# Patient Record
Sex: Male | Born: 2000 | Race: Black or African American | Hispanic: No | Marital: Single | Smoking: Never smoker
Health system: Southern US, Community
[De-identification: ages and names within clinical notes are randomized; demographics above are authoritative.]

## PROBLEM LIST (undated history)

## (undated) DIAGNOSIS — T7840XA Allergy, unspecified, initial encounter: Secondary | ICD-10-CM

## (undated) HISTORY — DX: Allergy, unspecified, initial encounter: T78.40XA

---
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
## 2019-03-10 IMAGING — DX DG CHEST 1V PORT
2 series · 2 of 2 positions shown · non-contrast
Comparison: 03/16/2017

CLINICAL DATA: Respiratory failure

EXAM:
PORTABLE CHEST 1 VIEW

[chest ap (1 of 2)]
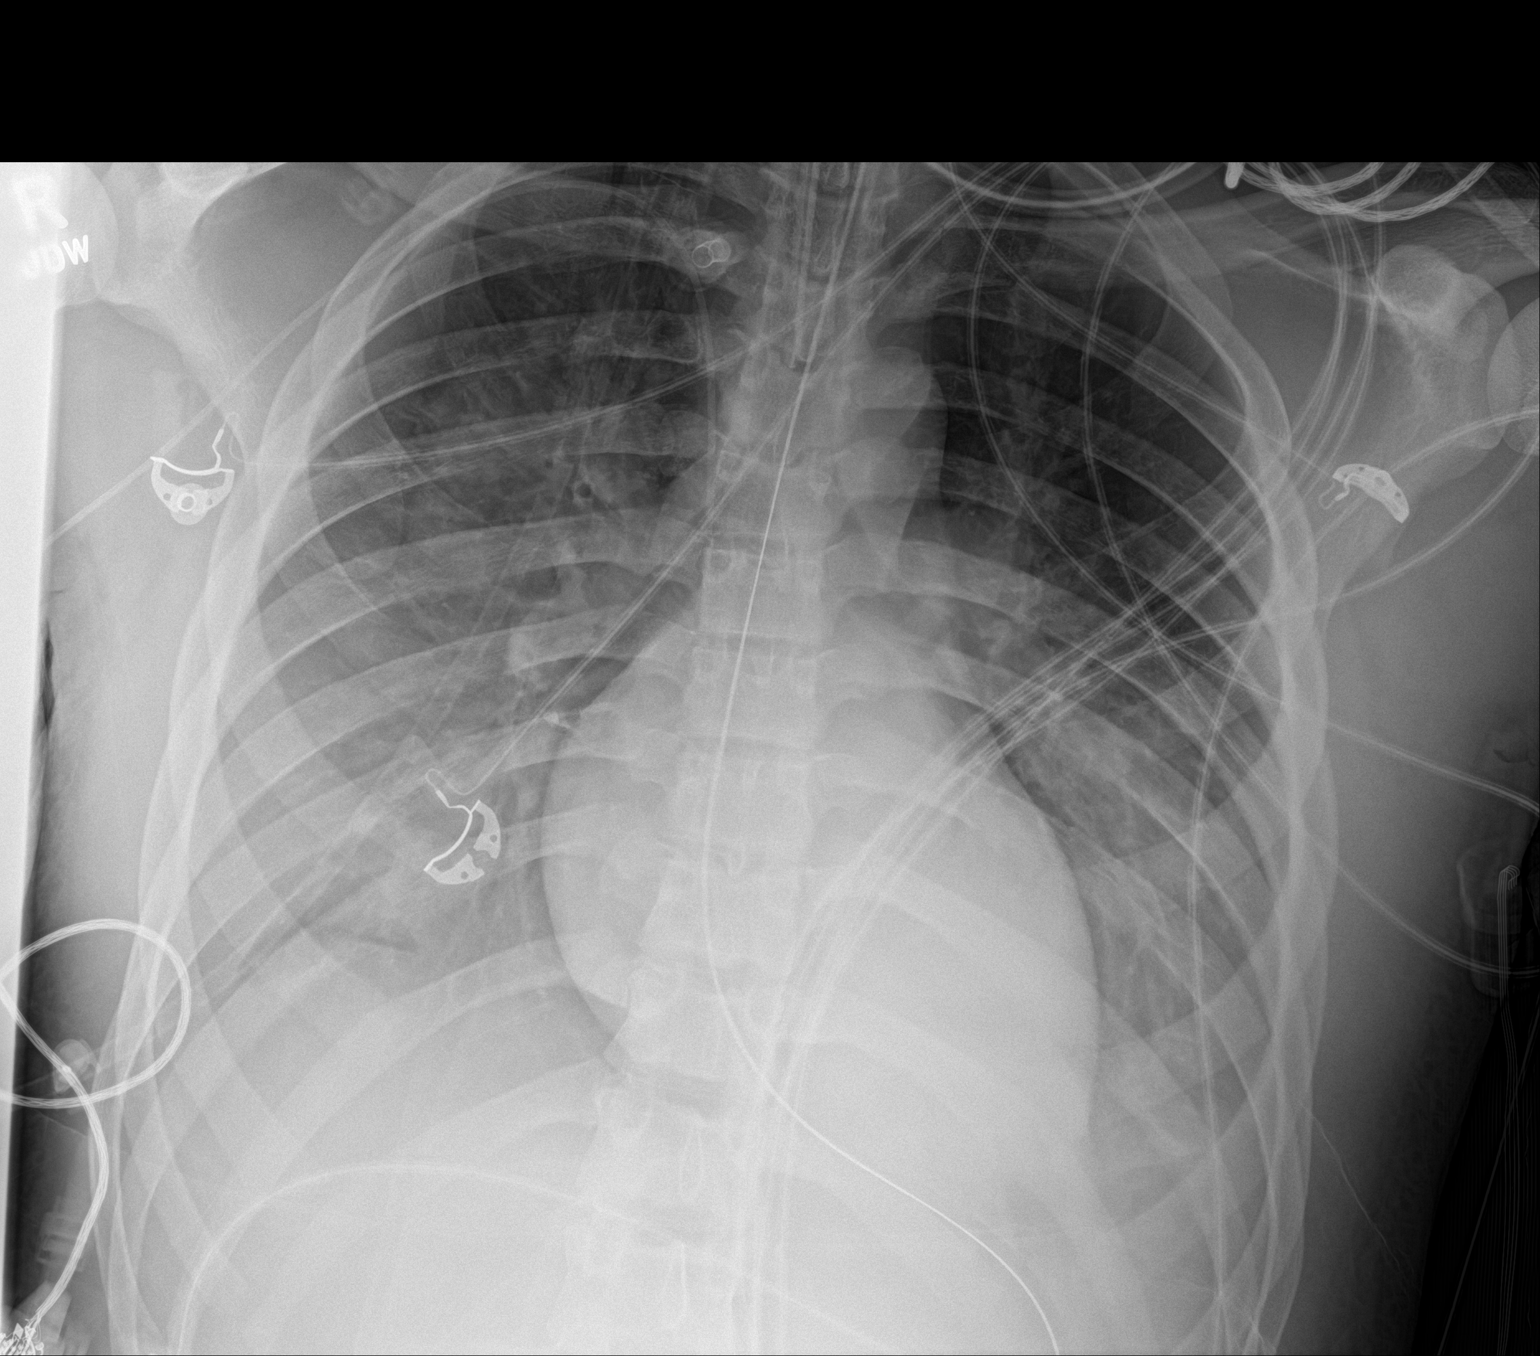

[chest ap (2 of 2)]
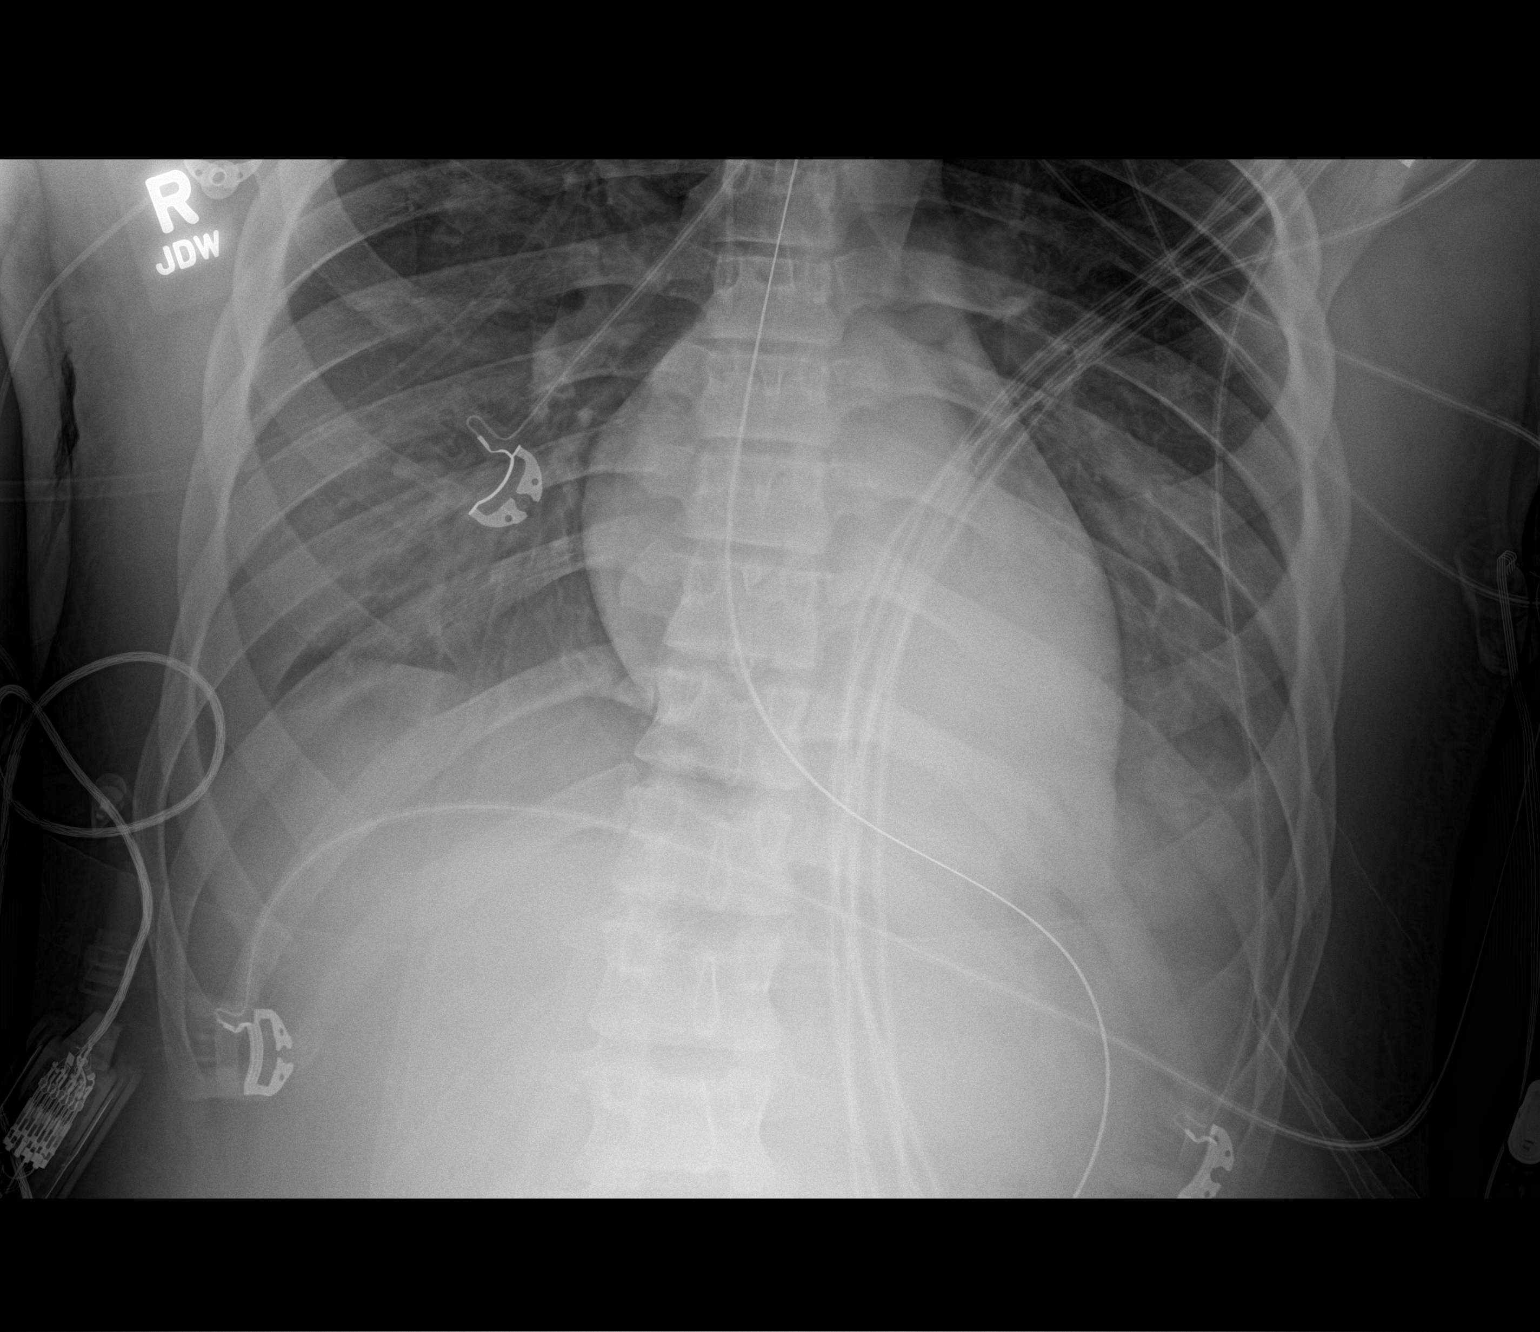

[2 of 2 positions shown; findings below may reference images not displayed]

FINDINGS: Endotracheal tube tip is approximately 3.3 cm superior to the
carina. Esophageal tube tip is below the diaphragm but not included.
Right upper extremity catheter tip overlies the SVC.

Development of diffuse hazy bilateral airspace disease with
consolidation at the lung bases. Stable cardiomediastinal
silhouette. No pneumothorax is seen. There is pericardial lucency.
IMPRESSION: 1. Endotracheal tube tip about 3.3 cm superior to carina
2. Interim development of diffuse hazy airspace disease within the
mid to lower lungs which may reflect edema. Bibasilar consolidations
have also developed. There is vascular congestion.
3. Lucency silhouetting the heart, cannot exclude small amount of
pneumopericardium. Attention on follow-up chest radiograph.

## 2019-03-11 IMAGING — DX DG CHEST 1V PORT
1 series · 1 of 1 positions shown · non-contrast
Comparison: Radiographs yesterday at 1495 hour (1 hour prior)

CLINICAL DATA: Chest tube placement.

EXAM:
PORTABLE CHEST 1 VIEW

[chest]
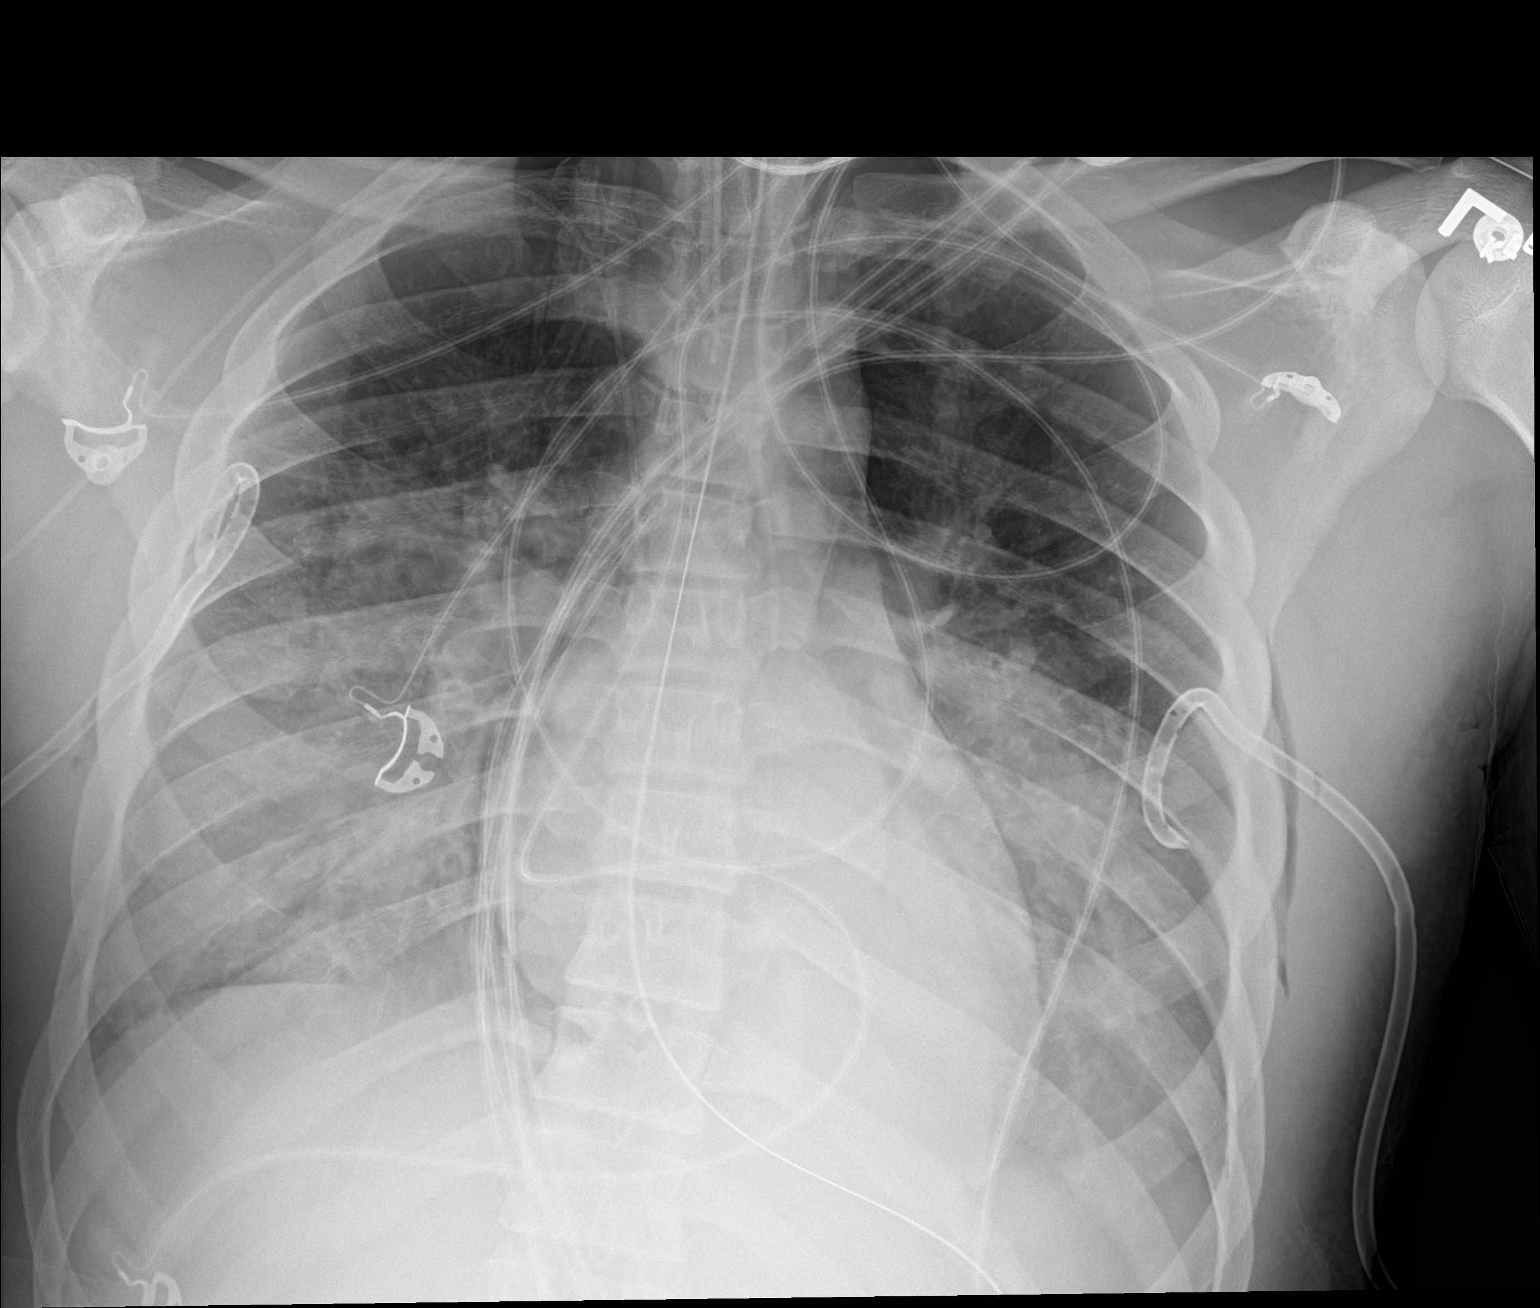

[1 of 1 positions shown; findings below may reference images not displayed]

FINDINGS: Interval placement of bilateral pigtail catheters in the mid
hemithorax. Small amount of air in the adjacent subcutaneous
tissues. Small right apical pneumothorax, with possible lateral
component. Possible small left pneumothorax at the apex.

Endotracheal tube 3.7 cm from the carina. Enteric tube in place with
tip below the diaphragm not included in the field of view. Tip of
the right central line in the mid SVC. Improving hazy opacity at the
lung bases likely decreasing pleural effusions and atelectasis. The
heart is normal in size. Lucency paralleling the left heart border
is again seen with possible pneumopericardium.
IMPRESSION: 1. Placement of bilateral pigtail catheters. There is a small right
apical pneumothorax with possible lateral component. Possible small
left apical pneumothorax. Lucency paralleling the left heart border
concerning for pneumomediastinum/pneumopericardium, as suggested
previously.
2. Persistent but decreasing hazy opacity at the lung bases
suggesting improving pleural effusions.

These results will be called to the ordering clinician or
representative by the Radiologist Assistant, and communication
documented in the PACS or zVision Dashboard.

## 2019-03-12 IMAGING — DX DG CHEST 1V PORT
1 series · 1 of 1 positions shown · non-contrast
Comparison: Yesterday

CLINICAL DATA: Respiratory failure

EXAM:
PORTABLE CHEST 1 VIEW

[chest]
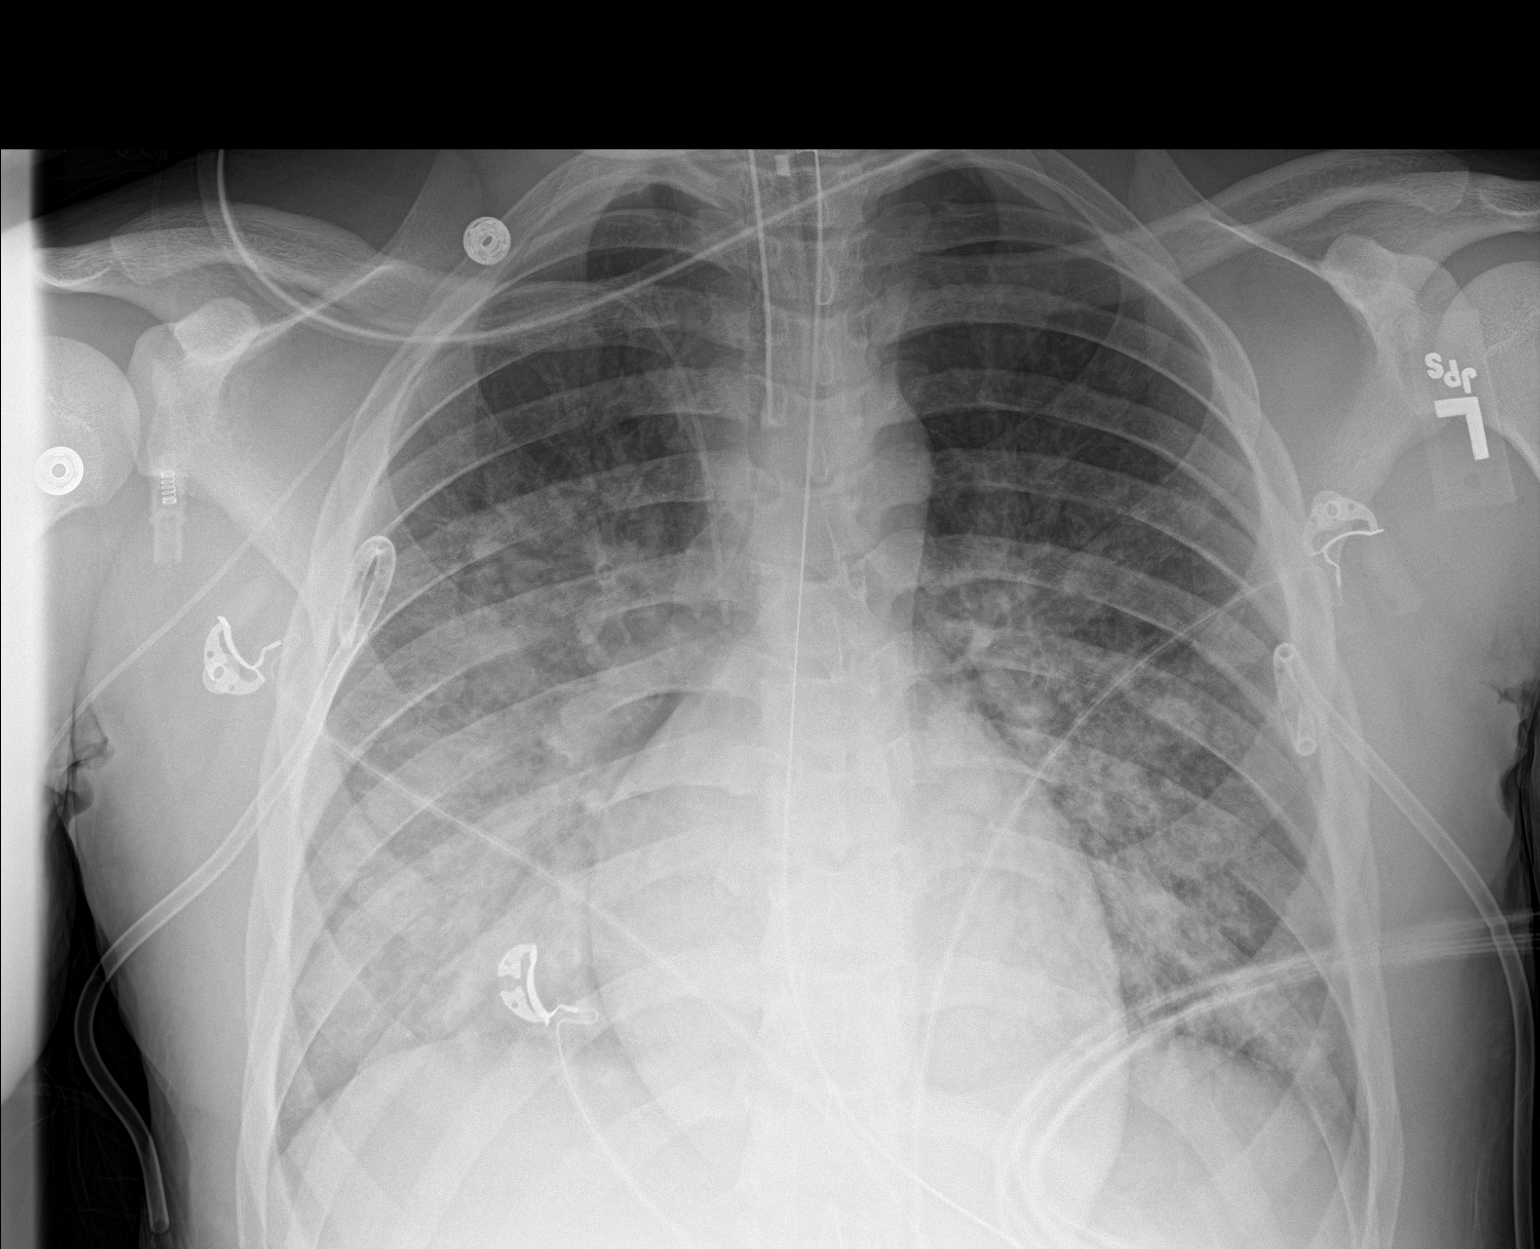

[1 of 1 positions shown; findings below may reference images not displayed]

FINDINGS: Endotracheal tube tip just below the clavicular heads. An orogastric
tube reaches the stomach. Bilateral chest tube in stable position.
Right upper extremity PICC with tip at the distal SVC.

Bilateral airspace opacity that is increasing. No visible
pneumothorax. No visible effusion. No vascular pedicle widening.
Normal heart size.
IMPRESSION: 1. Stable hardware positioning.
2. Increasing bilateral airspace disease, question ARDS, neurogenic
edema or sequela of aspiration.
3. No visible pneumothorax.
